# Patient Record
Sex: Female | Born: 2000 | Race: White | Hispanic: No | Marital: Single
Health system: Southern US, Community
[De-identification: ages and names within clinical notes are randomized; demographics above are authoritative.]

## PROBLEM LIST (undated history)

## (undated) DIAGNOSIS — G43909 Migraine, unspecified, not intractable, without status migrainosus: Secondary | ICD-10-CM

## (undated) DIAGNOSIS — J45909 Unspecified asthma, uncomplicated: Secondary | ICD-10-CM

## (undated) HISTORY — PX: TONSILLECTOMY: SUR1361

## (undated) HISTORY — PX: TONSILLECTOMY AND ADENOIDECTOMY: SHX28

## (undated) HISTORY — PX: ADENOIDECTOMY: SUR15

---
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
## 2022-06-10 DIAGNOSIS — H5213 Myopia, bilateral: Secondary | ICD-10-CM | POA: Diagnosis not present

## 2023-06-16 DIAGNOSIS — H5213 Myopia, bilateral: Secondary | ICD-10-CM | POA: Diagnosis not present

## 2023-11-20 ENCOUNTER — Ambulatory Visit

## 2023-11-23 ENCOUNTER — Ambulatory Visit (LOCAL_COMMUNITY_HEALTH_CENTER): Admitting: Family Medicine

## 2023-11-23 ENCOUNTER — Encounter: Payer: Self-pay | Admitting: Family Medicine

## 2023-11-23 VITALS — BP 111/72 | HR 87 | Ht 61.0 in | Wt 224.0 lb

## 2023-11-23 DIAGNOSIS — Z309 Encounter for contraceptive management, unspecified: Secondary | ICD-10-CM | POA: Diagnosis not present

## 2023-11-23 DIAGNOSIS — Z7251 High risk heterosexual behavior: Secondary | ICD-10-CM

## 2023-11-23 DIAGNOSIS — Z3009 Encounter for other general counseling and advice on contraception: Secondary | ICD-10-CM

## 2023-11-23 MED ORDER — ELLA 30 MG PO TABS: 1.0000 | ORAL_TABLET | Freq: Once | ORAL | Status: AC

## 2023-11-23 MED ORDER — ULIPRISTAL ACETATE 30 MG PO TABS: 30.0000 mg | ORAL_TABLET | Freq: Once | ORAL | Status: DC

## 2023-11-23 NOTE — Progress Notes (Signed)
 [employer] HEALTH DEPARTMENT [facility] [address] [place] [address] Main phone: [phone]  Family Planning Visit - Initial Visit  Subjective:  Teresa Pruitt is a 23 y.o.  No obstetric history on file.   being seen today for an initial annual visit and to discuss reproductive life planning.  The patient is currently using no method - no contraceptive precautions for pregnancy prevention. Patient does not want a pregnancy in the next year.   Patient reports they are looking for a method with the following characteristics:  Method that does not involve too much memory  Patient has the following medical conditions: Patient Active Problem List   Diagnosis Date Noted   Migraine without aura and without status migrainosus, not intractable 09/21/2015   Tension headache 09/21/2015   Anxiety state 09/21/2015   Depressed mood 09/21/2015   Chief Complaint  Patient presents with   Annual Exam    Pt is here for PE and depo     HPI Patient reports desire for depo. She is in a new relationship (around 1 month) and wants consistent contraception. She has had frequent unprotected intercourse throughout the month. Last period was 11/01/23. She has regular periods monthly.   Patient denies concerns, bothersome symptoms.   Review of Systems  All other systems reviewed and are negative.  Diabetes screening This patient is 23 y.o. with a BMI of Body mass index is 42.32 kg/m.SABRA  Is patient eligible for diabetes screening (age >35 and BMI >25)?  no  Was Hgb A1c ordered? no  STI screening Patient reports 1 of partners in last year.  Does this patient desire STI screening?  No - discussed and offered, patient declined but may consider in future  Hepatitis C screening Has patient been screened once for HCV in the past?  No  No results found for: HCVAB  Does the patient meet criteria for HCV testing? Did not assess, patient did not want  testing/bloodwork (If yes-- Screen for HCV through [facility]) Criteria:  Since the last HCV result, does the patient have any of the following? - Current drug use - Have a partner with drug use - Has been incarcerated  Hepatitis B screening Does the patient meet criteria for HBV testing? Not assessed, patient does not want bloodwork Criteria:  -Household, sexual or needle sharing contact with HBV -History of drug use -HIV positive -Those with known Hep C  Cervical Cancer Screening  No Cervical Cancer Screening results to display.  Health Maintenance Due  Topic Date Due   CHLAMYDIA SCREENING  Never done   HPV VACCINES (1 - 3-dose series) Never done   HIV Screening  Never done   Meningococcal B Vaccine (1 of 2 - Standard) Never done   Hepatitis C Screening  Never done   DTaP/Tdap/Td (1 - Tdap) Never done   Pneumococcal Vaccine 72-89 Years old (1 of 2 - PCV) Never done   Hepatitis B Vaccines (1 of 3 - 19+ 3-dose series) Never done   COVID-19 Vaccine (1 - 2024-25 season) Never done   The following portions of the patient's history were reviewed and updated as appropriate: allergies, current medications, past family history, past medical history, past social history, past surgical history and problem list. Problem list updated.  See flowsheet for further details and programmatic requirements Hyperlink available at the top of the signed note in blue.  Flow sheet content below:  Pregnancy Intention Screening Does the patient want to  become pregnant in the next year?: No Does the patient's partner want to become pregnant in the next year?: No Would the patient like to discuss contraceptive options today?: Yes Contraception History Past methods of contraception used by patient:: None Sexual History What age did you start your period?: 15 How often do you have your period?: monthly Date of last sex?: 11/22/23 Has the patient had unprotected sex within the last 5 days?: Yes Do  you have sex with men, women, both men and women?: Men only In the past 2 months how many partners have you had sex with?: 1 In the past 12 months, how many partners have you had sex with?: 1 Is it possible that any of your sex partners in the past 12 months had sex with someone else whild they were still in a sexual relationship with you?: No What ways do you have sex?: Vaginal Do you or your partner use condoms and/or dental dams every time you have vaginal, oral or anal sex?: No Do you douche?: Yes How often?: monthly Last time?: last month Have you ever had an STD?: No Have any of your partners had an STD?: No Have you or your partner ever shot up drugs?: No Have any of your partners used drugs in the past?: No Have you or your partners exchanged money or drugs for sex?: No  Objective:   Vitals:   11/23/23 1516  BP: 111/72  Pulse: 87  Weight: 224 lb (101.6 kg)  Height: 5' 1 (1.549 m)   Physical Exam Constitutional:      Appearance: Normal appearance.  HENT:     Head: Normocephalic and atraumatic.  Pulmonary:     Effort: Pulmonary effort is normal.  Musculoskeletal:        General: Normal range of motion.  Skin:    General: Skin is warm and dry.  Neurological:     General: No focal deficit present.     Mental Status: She is alert.  Psychiatric:        Mood and Affect: Mood normal.        Behavior: Behavior normal.    Assessment and Plan:  Teresa Pruitt is a 23 y.o. female presenting to the [facility] Department for an initial annual wellness/contraceptive visit  1. Unprotected sex  - ulipristal acetate  (ELLA ) 30 MG tablet; Take 1 tablet (30 mg total) by mouth once for 1 dose.  2. Family planning (Primary)  Contraception counseling:  Reviewed options based on patient desire and reproductive life plan. Patient is interested in Hormonal Injection. This was not provided to the patient today due to many episodes of unprotected sex this  month.  Risks, benefits, and typical effectiveness rates were reviewed.  Questions were answered.  Written information was also given to the patient to review.    The patient will follow up in around 1 week, post period for her depo shot. The patient was told to call with any further questions, or with any concerns about this method of contraception.  Emphasized use of condoms 100% of the time for STI prevention.  Emergency Contraception Precautions (ECP): Patient assessed for need of ECP. She is a candidate based on report of unprotected sex within past 72 hours (3 days).  Educated on ECP and reviewed options.  Patient desires Ella  (Ulipristal).    No follow-ups on file.  No future appointments.  Damien Satchel, NP  Attestation of Attending Supervision of Advanced Practice Provider (CNM/NP/PA):  Evaluation, management, and  procedures were performed by the Advanced Practice Provider under my supervision and collaboration.  I have reviewed the Advanced Practice Provider's note and chart, and I agree with the management and plan.  Dorothyann Helling, MD Clinical Services Medical Director [facility] Department 11/29/23  5:31 PM

## 2023-11-23 NOTE — Progress Notes (Signed)
 Patient is here for family planning visit. Family planning education card given to patient. Teresa Pruitt  given to patient per order by JAYSON Helling, MD. Patient will RTC in about 1-2 weeks for depo. All questions answered and verbalizes understanding.  Doyce CINDERELLA Shuck, RN

## 2023-11-29 ENCOUNTER — Ambulatory Visit

## 2023-11-29 ENCOUNTER — Encounter: Payer: Self-pay | Admitting: Physician Assistant

## 2023-11-29 VITALS — BP 112/69 | HR 84 | Ht 61.0 in | Wt 225.4 lb

## 2023-11-29 DIAGNOSIS — Z309 Encounter for contraceptive management, unspecified: Secondary | ICD-10-CM | POA: Diagnosis not present

## 2023-11-29 DIAGNOSIS — Z30013 Encounter for initial prescription of injectable contraceptive: Secondary | ICD-10-CM | POA: Diagnosis not present

## 2023-11-29 DIAGNOSIS — Z3009 Encounter for other general counseling and advice on contraception: Secondary | ICD-10-CM

## 2023-11-29 DIAGNOSIS — Z7251 High risk heterosexual behavior: Secondary | ICD-10-CM

## 2023-11-29 MED ORDER — MEDROXYPROGESTERONE ACETATE 150 MG/ML IM SUSP
150.0000 mg | INTRAMUSCULAR | Status: AC
  Administered 2023-11-29 – 2024-05-14 (×3): 150 mg via INTRAMUSCULAR

## 2023-11-29 NOTE — Progress Notes (Signed)
 S: Pt presents for F/u family planning visit after emergency contraception provided at annual physical on 11/23/23 after UPI. She desires initiation of DPMA today. She reports typical menstrual type cramps prior to taking the Ella  on 11/23/23 and then started having a lighter than normal period that day. Bleeding ended yesterday. She reports a single episode of intercourse with coitus interruptus yesterday (did not have condoms handy). O: Pleasant young woman in NAD. Reviewed vitals from 11/23/23, including BP of 111/72 and weight 224 lb. A/P 1. Unprotected sex (Primary) Recommend patient do home pregnancy test in 3 weeks. If positive, come to clinic for testing. 2. Family planning Discussed low risk of pregnancy in light of very recent menses/use of EC. Via shared decision making, elected to start DMPA 150 mg IM today and repeat every 3 mo for 1 year if home UPT is neg in 3 weeks. Recommend no sex for 7 days while awaiting DMPA efficacy for contraception. Recommend condoms with all sex to prevent STI. - medroxyPROGESTERone  (DEPO-PROVERA ) injection 150 mg

## 2023-11-29 NOTE — Progress Notes (Signed)
Depo given, tolerated well, next Depo card given. .Thalia Turkington Brewer-Jensen, RN  

## 2024-02-20 ENCOUNTER — Ambulatory Visit

## 2024-02-20 VITALS — BP 107/57 | Ht 61.0 in | Wt 226.0 lb

## 2024-02-20 DIAGNOSIS — Z3042 Encounter for surveillance of injectable contraceptive: Secondary | ICD-10-CM

## 2024-02-20 DIAGNOSIS — Z3009 Encounter for other general counseling and advice on contraception: Secondary | ICD-10-CM

## 2024-02-20 DIAGNOSIS — Z309 Encounter for contraceptive management, unspecified: Secondary | ICD-10-CM | POA: Diagnosis not present

## 2024-02-20 DIAGNOSIS — Z30013 Encounter for initial prescription of injectable contraceptive: Secondary | ICD-10-CM | POA: Diagnosis not present

## 2024-02-20 NOTE — Progress Notes (Signed)
 11 Weeks   6 Days since last Depo   Voices no concerns today.  Counseled to adhere to 11 to 13 week intervals between depo injections for optimal benefit.  Depo given today per order by A Streilein, PA-C  dated 11/29/2023.  Tolerated well L delt.  Next depo due 05/07/2024,  has reminder card.  Shafter Jupin, RN

## 2024-05-14 ENCOUNTER — Ambulatory Visit

## 2024-05-14 VITALS — BP 115/57 | Ht 61.0 in | Wt 238.5 lb

## 2024-05-14 DIAGNOSIS — Z3042 Encounter for surveillance of injectable contraceptive: Secondary | ICD-10-CM

## 2024-05-14 DIAGNOSIS — Z3009 Encounter for other general counseling and advice on contraception: Secondary | ICD-10-CM

## 2024-05-14 DIAGNOSIS — Z30013 Encounter for initial prescription of injectable contraceptive: Secondary | ICD-10-CM | POA: Diagnosis not present

## 2024-05-14 DIAGNOSIS — Z309 Encounter for contraceptive management, unspecified: Secondary | ICD-10-CM | POA: Diagnosis not present

## 2024-05-14 NOTE — Progress Notes (Signed)
 12 Weeks   0 Days since last Depo    Voices no concerns today.  Counseled to adhere to 11 to 13 week intervals between depo injections for optimal benefit.  Depo given today per order by A Streilein PA-C  dated 11/29/2023.  Tolerated well Left delt (arm preference per pt).  Next depo due 07/30/2024,  has reminder card.  Teresa Steinhauser, RN
